# Patient Record
Sex: Female | Born: 1975 | Race: Black or African American | Hispanic: No | Marital: Single | Smoking: Current every day smoker
Health system: Southern US, Community
[De-identification: ages and names within clinical notes are randomized; demographics above are authoritative.]

## PROBLEM LIST (undated history)

## (undated) ENCOUNTER — Emergency Department (HOSPITAL_COMMUNITY): Discharge status: Absent without leave | Payer: Self-pay

## (undated) DIAGNOSIS — D219 Benign neoplasm of connective and other soft tissue, unspecified: Secondary | ICD-10-CM

---

## 2012-07-17 DIAGNOSIS — R238 Other skin changes: Secondary | ICD-10-CM | POA: Insufficient documentation

## 2012-07-17 DIAGNOSIS — R634 Abnormal weight loss: Secondary | ICD-10-CM | POA: Insufficient documentation

## 2012-07-17 DIAGNOSIS — M5430 Sciatica, unspecified side: Secondary | ICD-10-CM | POA: Insufficient documentation

## 2012-07-17 DIAGNOSIS — R110 Nausea: Secondary | ICD-10-CM | POA: Insufficient documentation

## 2012-07-28 DIAGNOSIS — M25579 Pain in unspecified ankle and joints of unspecified foot: Secondary | ICD-10-CM | POA: Insufficient documentation

## 2012-08-25 DIAGNOSIS — D259 Leiomyoma of uterus, unspecified: Secondary | ICD-10-CM | POA: Insufficient documentation

## 2012-08-25 DIAGNOSIS — D1803 Hemangioma of intra-abdominal structures: Secondary | ICD-10-CM | POA: Insufficient documentation

## 2012-10-13 DIAGNOSIS — K219 Gastro-esophageal reflux disease without esophagitis: Secondary | ICD-10-CM | POA: Insufficient documentation

## 2017-10-19 ENCOUNTER — Encounter (HOSPITAL_COMMUNITY): Payer: Self-pay | Admitting: Emergency Medicine

## 2017-10-19 ENCOUNTER — Emergency Department (HOSPITAL_COMMUNITY)
Admission: EM | Admit: 2017-10-19 | Discharge: 2017-10-19 | Disposition: A | Discharge status: Home / Self Care | Payer: Self-pay | Attending: Emergency Medicine | Admitting: Emergency Medicine

## 2017-10-19 DIAGNOSIS — K089 Disorder of teeth and supporting structures, unspecified: Secondary | ICD-10-CM | POA: Insufficient documentation

## 2017-10-19 DIAGNOSIS — K0889 Other specified disorders of teeth and supporting structures: Secondary | ICD-10-CM

## 2017-10-19 NOTE — ED Triage Notes (Signed)
Pt reports right sided dental pain (upper and lower) states she was seen at another hospital yesterday and given cephalexin and hydrocodone but reports pain is not getting any better and now there is some swelling.

## 2017-10-19 NOTE — Discharge Instructions (Addendum)
Please return for any problem. Follow up with Dentistry as instructed.

## 2017-10-19 NOTE — ED Provider Notes (Signed)
[place] EMERGENCY DEPARTMENT Provider Note   CSN: [id number] Arrival date & time: 10/19/17  0753     History   Chief Complaint Chief Complaint  Patient presents with  . Dental Pain    HPI Annette Vargas is a 42 y.o. female.  42 year old female without significant prior medical history presents with complaint of dental pain.  Patient was seen at another facility yesterday for same complaint.  She reports that she was started on antibiotic and given Norco at that facility for pain control.  She also reports getting a dental block with minimal improvement in her pain.  She presents today complaining of the same pain to the right lower jaw.  She denies associated fever.  She requests additional prescriptions for pain control.  She has yet to establish follow-up with dentistry.  The history is provided by the patient and medical records.  Dental Pain   This is a recurrent problem. The current episode started more than 2 days ago. The problem occurs daily. The problem has not changed since onset.The pain is mild. The treatment provided no relief.    History reviewed. No pertinent past medical history.  There are no active problems to display for this patient.   History reviewed. No pertinent surgical history.   OB History   None      Home Medications    Prior to Admission medications   Not on File    Family History No family history on file.  Social History Social History   Tobacco Use  . Smoking status: Not on file  Substance Use Topics  . Alcohol use: Not on file  . Drug use: Not on file     Allergies   Penicillins   Review of Systems Review of Systems  HENT: Positive for dental problem.   All other systems reviewed and are negative.    Physical Exam Updated Vital Signs BP 133/80   Pulse 71   Temp 98.9 F (37.2 C) (Oral)   Resp 15   SpO2 100%   Physical Exam  Constitutional: She is oriented to person, place, and time.  She appears well-developed and well-nourished. No distress.  HENT:  Head: Normocephalic and atraumatic.  Mouth/Throat: Oropharynx is clear and moist.  Mild dental tenderness to the right lower jaw -  At approximately the 29th - 31st teeth - no dental abscess noted   Eyes: Pupils are equal, round, and reactive to light. Conjunctivae and EOM are normal.  Neck: Normal range of motion. Neck supple.  Cardiovascular: Normal rate, regular rhythm and normal heart sounds.  Pulmonary/Chest: Effort normal and breath sounds normal. No respiratory distress.  Abdominal: Soft. She exhibits no distension. There is no tenderness.  Musculoskeletal: Normal range of motion. She exhibits no edema or deformity.  Neurological: She is alert and oriented to person, place, and time.  Skin: Skin is warm and dry.  Psychiatric: She has a normal mood and affect.  Nursing note and vitals reviewed.    ED Treatments / Results  Labs (all labs ordered are listed, but only abnormal results are displayed) Labs Reviewed - No data to display  EKG None  Radiology No results found.  Procedures Procedures (including critical care time)  Medications Ordered in ED Medications - No data to display   Initial Impression / Assessment and Plan / ED Course  I have reviewed the triage vital signs and the nursing notes.  Pertinent labs & imaging results that were available during my care  of the patient were reviewed by me and considered in my medical decision making (see chart for details).     MDM  Screen Complete  Patient is presenting for evaluation of dental pain.  Patient was already started on antibiotics 24 hours ago.  She already has a prescription of Norco to take at home for her pain.  She is without evidence of other acute dental pathology - such as abscess.  She declines repeat dental block.  She understands the need for close follow-up with dentistry.  Strict return precautions are given and  understood.  Final Clinical Impressions(s) / ED Diagnoses   Final diagnoses:  Pain, dental    ED Discharge Orders    None       Valarie Merino, MD 10/19/17 [phone]

## 2018-07-02 ENCOUNTER — Emergency Department (HOSPITAL_COMMUNITY)
Admission: EM | Admit: 2018-07-02 | Discharge: 2018-07-02 | Disposition: A | Discharge status: Home / Self Care | Payer: Self-pay | Attending: Emergency Medicine | Admitting: Emergency Medicine

## 2018-07-02 ENCOUNTER — Other Ambulatory Visit: Payer: Self-pay

## 2018-07-02 ENCOUNTER — Encounter (HOSPITAL_COMMUNITY): Payer: Self-pay | Admitting: *Deleted

## 2018-07-02 DIAGNOSIS — F17210 Nicotine dependence, cigarettes, uncomplicated: Secondary | ICD-10-CM | POA: Insufficient documentation

## 2018-07-02 DIAGNOSIS — K0889 Other specified disorders of teeth and supporting structures: Secondary | ICD-10-CM | POA: Insufficient documentation

## 2018-07-02 MED ORDER — CLINDAMYCIN HCL 300 MG PO CAPS: 300.0000 mg | ORAL_CAPSULE | Freq: Three times a day (TID) | ORAL | 0 refills | Status: DC

## 2018-07-02 MED ORDER — IBUPROFEN 600 MG PO TABS: 600.0000 mg | ORAL_TABLET | Freq: Three times a day (TID) | ORAL | 0 refills | Status: DC

## 2018-07-02 MED ORDER — TRAMADOL HCL 50 MG PO TABS: 50.0000 mg | ORAL_TABLET | Freq: Four times a day (QID) | ORAL | 0 refills | Status: DC | PRN

## 2018-07-02 NOTE — ED Notes (Signed)
Patient verbalizes understanding of discharge instructions. Opportunity for questioning and answers were provided. Armband removed by staff, pt discharged from ED ambulatory by self\  

## 2018-07-02 NOTE — ED Triage Notes (Signed)
PT reports dental pain at gum on RT upper side.

## 2018-07-02 NOTE — ED Provider Notes (Signed)
[facility] [place] EMERGENCY DEPARTMENT Provider Note   CSN: [id number] Arrival date & time: 07/02/18  1507    History   Chief Complaint Chief Complaint  Patient presents with  . Dental Pain    HPI Annette Vargas is a 43 y.o. female who presents with dental pain.  Past medical history significant for smoking.  The patient states that she was eating barbecue ribs and developed an acute onset of pain earlier today while at work.  The pain is over the right lower molar.  It is also over the gums.  The pain is constant and nonradiating.  She tried ibuprofen and Aleve without relief.  Since the pain was not improving she decided to come to the ED.  She has had problems with this tooth in the past.  She went to [facility] last year and received a dental block, pain medicine, antibiotics.  Initially she did not get relief but after several days of taking the medicine she has not not had a problem since then.  She has not seen a dentist in a long time.  Any fever or difficulty swallowing.     HPI  History reviewed. No pertinent past medical history.  There are no active problems to display for this patient.   History reviewed. No pertinent surgical history.   OB History   No obstetric history on file.      Home Medications    Prior to Admission medications   Not on File    Family History History reviewed. No pertinent family history.  Social History Social History   Tobacco Use  . Smoking status: Current Every Day Smoker    Packs/day: 0.50    Types: Cigarettes  . Smokeless tobacco: Never Used  Substance Use Topics  . Alcohol use: Not Currently  . Drug use: Yes    Frequency: 3.0 times per week    Types: Marijuana     Allergies   Penicillins   Review of Systems Review of Systems  Constitutional: Negative for fever.  HENT: Positive for dental problem.      Physical Exam Updated Vital Signs BP 137/79 (BP Location: Right Arm)   Pulse 72   Temp  98.7 F (37.1 C) (Oral)   Ht 6' (1.829 m)   Wt 56.7 kg   LMP 06/30/2018   SpO2 100%   BMI 16.95 kg/m   Physical Exam Vitals signs and nursing note reviewed.  Constitutional:      General: She is not in acute distress.    Appearance: Normal appearance. She is well-developed. She is not ill-appearing.     Comments: Calm and cooperative. NAD  HENT:     Head: Normocephalic and atraumatic.     Mouth/Throat:     Lips: Pink.     Mouth: Mucous membranes are moist.     Dentition: Dental tenderness present. No dental caries, dental abscesses or gum lesions.     Pharynx: Oropharynx is clear.     Tonsils: No tonsillar exudate.      Comments: Overall good dentition. Posterior lower wisdom teeth have been pulled Eyes:     General: No scleral icterus.       Right eye: No discharge.        Left eye: No discharge.     Conjunctiva/sclera: Conjunctivae normal.     Pupils: Pupils are equal, round, and reactive to light.  Neck:     Musculoskeletal: Normal range of motion.  Cardiovascular:     Rate  and Rhythm: Normal rate.  Pulmonary:     Effort: Pulmonary effort is normal. No respiratory distress.  Abdominal:     General: There is no distension.  Skin:    General: Skin is warm and dry.  Neurological:     Mental Status: She is alert and oriented to person, place, and time.  Psychiatric:        Behavior: Behavior normal.      ED Treatments / Results  Labs (all labs ordered are listed, but only abnormal results are displayed) Labs Reviewed - No data to display  EKG None  Radiology No results found.  Procedures Procedures (including critical care time)  Medications Ordered in ED Medications - No data to display   Initial Impression / Assessment and Plan / ED Course  I have reviewed the triage vital signs and the nursing notes.  Pertinent labs & imaging results that were available during my care of the patient were reviewed by me and considered in my medical decision  making (see chart for details).  44 year old with dental pain starting today. Patient is afebrile, non toxic appearing, and swallowing secretions well. No concerning findings on exam. No obvious abscess and doubt deep space head or neck infection. She was offered a dental block today and declined. Will give rx for Ibuprofen, Tramadol, Clindamycin. I gave patient referral to dentist and stressed the importance of dental follow up for ultimate management of dental pain    Final Clinical Impressions(s) / ED Diagnoses   Final diagnoses:  Pain, dental    ED Discharge Orders    None       Recardo Evangelist, PA-C 07/02/18 Amber, MD 07/02/18 1954

## 2018-07-02 NOTE — Discharge Instructions (Signed)
Take Ibuprofen 600mg  three times a day with food. This is for pain and inflammation Take Tramadol as needed for more severe pain Take Clindamycin three times a day for one week Eat soft foods until your tooth feels better Follow up with a dentist

## 2018-10-09 ENCOUNTER — Ambulatory Visit: Payer: BC Managed Care – PPO

## 2018-10-09 ENCOUNTER — Encounter: Payer: Self-pay | Admitting: Podiatry

## 2018-10-09 ENCOUNTER — Other Ambulatory Visit: Payer: Self-pay | Admitting: Podiatry

## 2018-10-09 ENCOUNTER — Ambulatory Visit (INDEPENDENT_AMBULATORY_CARE_PROVIDER_SITE_OTHER): Payer: BC Managed Care – PPO

## 2018-10-09 ENCOUNTER — Other Ambulatory Visit: Payer: Self-pay

## 2018-10-09 ENCOUNTER — Ambulatory Visit: Payer: BC Managed Care – PPO | Admitting: Podiatry

## 2018-10-09 VITALS — BP 121/64 | HR 75 | Temp 98.1°F | Resp 16

## 2018-10-09 DIAGNOSIS — M216X9 Other acquired deformities of unspecified foot: Secondary | ICD-10-CM | POA: Diagnosis not present

## 2018-10-09 DIAGNOSIS — M79672 Pain in left foot: Secondary | ICD-10-CM | POA: Diagnosis not present

## 2018-10-09 DIAGNOSIS — B070 Plantar wart: Secondary | ICD-10-CM

## 2018-10-09 DIAGNOSIS — M79671 Pain in right foot: Secondary | ICD-10-CM

## 2018-10-09 DIAGNOSIS — B351 Tinea unguium: Secondary | ICD-10-CM

## 2018-10-09 NOTE — Progress Notes (Signed)
   Subjective:    Patient ID: Annette Vargas, female    DOB: 1975/07/22, 43 y.o.   MRN: [id number]  HPI    Review of Systems  All other systems reviewed and are negative.      Objective:   Physical Exam        Assessment & Plan:

## 2018-10-12 NOTE — Progress Notes (Signed)
Subjective:   Patient ID: Annette Vargas, female   DOB: 43 y.o.   MRN: [id number]   HPI Patient presents with a lot of lesions plantar aspect feet that upon walking she states are painful.  Also concerned about nail disease and discoloration of skin and patient does not smoke likes to be active   Review of Systems  All other systems reviewed and are negative.       Objective:  Physical Exam Vitals signs and nursing note reviewed.  Constitutional:      Appearance: She is well-developed.  Pulmonary:     Effort: Pulmonary effort is normal.  Musculoskeletal: Normal range of motion.  Skin:    General: Skin is warm.  Neurological:     Mental Status: She is alert.     Neurovascular status found to be within normal limits with patient found to have lesions on the left foot sub-met and great toe that upon debridement show pinpoint bleeding that is painful with lateral pressure.  Also states that the nails she is concerned about discoloration and thickness and does have good digital perfusion     Assessment:  Nail disease with verruca plantaris left with moderate discomfort associated with it     Plan:  H&P conditions reviewed and today deep debridement of lesions left accomplished and applied chemical to create immune response with sterile dressing.  I then went ahead discussed nails and we discussed different treatment options available and are going to defer at the current time

## 2018-11-06 ENCOUNTER — Ambulatory Visit: Payer: BC Managed Care – PPO | Admitting: Podiatry

## 2020-02-21 ENCOUNTER — Emergency Department (HOSPITAL_COMMUNITY)
Admission: EM | Admit: 2020-02-21 | Discharge: 2020-02-21 | Disposition: A | Discharge status: Home / Self Care | Payer: BC Managed Care – PPO | Attending: Emergency Medicine | Admitting: Emergency Medicine

## 2020-02-21 ENCOUNTER — Encounter (HOSPITAL_COMMUNITY): Payer: Self-pay | Admitting: Emergency Medicine

## 2020-02-21 ENCOUNTER — Other Ambulatory Visit: Payer: Self-pay

## 2020-02-21 DIAGNOSIS — D500 Iron deficiency anemia secondary to blood loss (chronic): Secondary | ICD-10-CM | POA: Insufficient documentation

## 2020-02-21 DIAGNOSIS — Z20822 Contact with and (suspected) exposure to covid-19: Secondary | ICD-10-CM | POA: Insufficient documentation

## 2020-02-21 DIAGNOSIS — N3000 Acute cystitis without hematuria: Secondary | ICD-10-CM | POA: Insufficient documentation

## 2020-02-21 DIAGNOSIS — R112 Nausea with vomiting, unspecified: Secondary | ICD-10-CM | POA: Diagnosis not present

## 2020-02-21 DIAGNOSIS — F17210 Nicotine dependence, cigarettes, uncomplicated: Secondary | ICD-10-CM | POA: Insufficient documentation

## 2020-02-21 DIAGNOSIS — R109 Unspecified abdominal pain: Secondary | ICD-10-CM | POA: Diagnosis not present

## 2020-02-21 HISTORY — DX: Benign neoplasm of connective and other soft tissue, unspecified: D21.9

## 2020-02-21 LAB — CBC
HCT: 32.4 % — ABNORMAL LOW (ref 36.0–46.0)
Hemoglobin: 9.4 g/dL — ABNORMAL LOW (ref 12.0–15.0)
MCH: 22.5 pg — ABNORMAL LOW (ref 26.0–34.0)
MCHC: 29 g/dL — ABNORMAL LOW (ref 30.0–36.0)
MCV: 77.5 fL — ABNORMAL LOW (ref 80.0–100.0)
Platelets: 339 10*3/uL (ref 150–400)
RBC: 4.18 MIL/uL (ref 3.87–5.11)
RDW: 17.4 % — ABNORMAL HIGH (ref 11.5–15.5)
WBC: 6.1 10*3/uL (ref 4.0–10.5)
nRBC: 0 % (ref 0.0–0.2)

## 2020-02-21 LAB — URINALYSIS, ROUTINE W REFLEX MICROSCOPIC
Bacteria, UA: NONE SEEN
Bilirubin Urine: NEGATIVE
Glucose, UA: NEGATIVE mg/dL
Hgb urine dipstick: NEGATIVE
Ketones, ur: 20 mg/dL — AB
Nitrite: NEGATIVE
Protein, ur: NEGATIVE mg/dL
Specific Gravity, Urine: 1.023 (ref 1.005–1.030)
pH: 5 (ref 5.0–8.0)

## 2020-02-21 LAB — COMPREHENSIVE METABOLIC PANEL
ALT: 16 U/L (ref 0–44)
AST: 20 U/L (ref 15–41)
Albumin: 4.6 g/dL (ref 3.5–5.0)
Alkaline Phosphatase: 45 U/L (ref 38–126)
Anion gap: 10 (ref 5–15)
BUN: 14 mg/dL (ref 6–20)
CO2: 21 mmol/L — ABNORMAL LOW (ref 22–32)
Calcium: 9.1 mg/dL (ref 8.9–10.3)
Chloride: 106 mmol/L (ref 98–111)
Creatinine, Ser: 0.82 mg/dL (ref 0.44–1.00)
GFR, Estimated: >60 mL/min (ref >60)
Glucose, Bld: 106 mg/dL — ABNORMAL HIGH (ref 70–99)
Potassium: 3.5 mmol/L (ref 3.5–5.1)
Sodium: 137 mmol/L (ref 135–145)
Total Bilirubin: 1.3 mg/dL — ABNORMAL HIGH (ref 0.3–1.2)
Total Protein: 8 g/dL (ref 6.5–8.1)

## 2020-02-21 LAB — I-STAT BETA HCG BLOOD, ED (MC, WL, AP ONLY): I-stat hCG, quantitative: <5 m[IU]/mL (ref <5)

## 2020-02-21 LAB — RESPIRATORY PANEL BY RT PCR (FLU A&B, COVID)
Influenza A by PCR: NEGATIVE
Influenza B by PCR: NEGATIVE
SARS Coronavirus 2 by RT PCR: NEGATIVE

## 2020-02-21 LAB — LIPASE, BLOOD: Lipase: 28 U/L (ref 11–51)

## 2020-02-21 MED ORDER — ONDANSETRON HCL 4 MG/2ML IJ SOLN
4.0000 mg | Freq: Once | INTRAMUSCULAR | Status: AC | PRN
  Administered 2020-02-21: 4 mg via INTRAVENOUS
  Filled 2020-02-21: qty 2

## 2020-02-21 MED ORDER — ONDANSETRON 4 MG PO TBDP: 4.0000 mg | ORAL_TABLET | Freq: Three times a day (TID) | ORAL | 0 refills | Status: AC | PRN

## 2020-02-21 MED ORDER — FERROUS SULFATE 325 (65 FE) MG PO TABS: ORAL_TABLET | ORAL | 0 refills | Status: AC

## 2020-02-21 MED ORDER — CEPHALEXIN 500 MG PO CAPS: 500.0000 mg | ORAL_CAPSULE | Freq: Four times a day (QID) | ORAL | 0 refills | Status: AC

## 2020-02-21 MED ORDER — SODIUM CHLORIDE 0.9 % IV BOLUS
1000.0000 mL | Freq: Once | INTRAVENOUS | Status: AC
  Administered 2020-02-21: 1000 mL via INTRAVENOUS

## 2020-02-21 NOTE — ED Triage Notes (Signed)
Patient presents with lower abdominal pain and vomiting x2 days., hx fibroids. Patient denies diarrhea, unsure of fevers. Patient states she has had sick coworkers, but they tested negative for COVID.

## 2020-02-21 NOTE — ED Notes (Signed)
Patient tolerated ginger ale well.  

## 2020-02-21 NOTE — Discharge Instructions (Addendum)
Thank you for allowing me to care for you today in the Emergency Department.   Your COVID-19 test was negative today.  Your urine was concerning for infection.  Urine culture has been sent to the lab.  Take 1 tablet of Keflex every 6 hours for the next 5 days.  Make sure to drink plenty of fluids if you continue to have loose stools.  For nausea and vomiting, let 1 tablet of Zofran dissolve in your tongue every 8 hours as needed.  Your hemoglobin is low today.  This is likely secondary to your history of uterine fibroids and heavy periods.  You can follow-up with primary care or your OB/GYN for anemia.  Take 1 tablet of ferrous sulfate every other day.  Return the emergency department if you have uncontrollable vomiting despite taking Zofran, severe abdominal pain with fevers, if you start making urine, if you have difficulty breathing, or other new, concerning symptoms.

## 2020-02-21 NOTE — ED Provider Notes (Signed)
[facility] DEPT Provider Note   CSN: [id number] Arrival date & time: 02/21/20  0203     History Chief Complaint  Patient presents with  . Abdominal Pain  . Emesis    Annette Vargas is a 44 y.o. female with a history of uterine fibroids and depression who presents to the emergency department with a chief complaint of vomiting.  The patient reports that she had 1 episode of nonbloody, nonbilious vomiting at work earlier [employer] and was told that she needed to come and get evaluated before she was able to return to work.  She reports that she has been having 2 days of intermittent lower abdominal pain.  She characterizes the pain as a crampy.  The pain will last for a few seconds and then resolved.  She reports that the pain is intense with onset and she is pain-free when it resolves.  She also reports that she has had 3-4 episodes of nonbloody diarrhea.  She has had a very mild cough, reports subjective fever and chills, and feels as if she has fluid located behind her right ear.  She denies sore throat, hearing loss, otorrhea, shortness of breath, chest pain, dysuria, hematuria, flank pain, vaginal bleeding or discharge, rash, loss of sense of taste or smell, or leg swelling.  No treatment for her symptoms prior to arrival.  She reports that she has been working for the last few days of one of her coworkers became sick last night with similar symptoms and informed her that he had food poisoning.  States that he tested negative for COVID-19.  She reports that a second coworker was also ill with similar symptoms, but she is unsure of their diagnosis.  The patient has not been vaccinated against COVID-19.  Last menstrual cycle was 2 weeks ago.  She reported this was heavy, which is her baseline.  She denies dizziness or lightheadedness or worsening fatigue.  The history is provided by the patient and medical records. No language interpreter was used.        Past Medical History:  Diagnosis Date  . Fibroids     Patient Active Problem List   Diagnosis Date Noted  . GERD (gastroesophageal reflux disease) 10/13/2012  . Hemangioma of liver 08/25/2012  . Uterine fibroid 08/25/2012  . Ankle pain 07/28/2012  . Abnormal weight loss 07/17/2012  . Easy bruising 07/17/2012  . Nausea 07/17/2012  . Sciatica 07/17/2012    History reviewed. No pertinent surgical history.   OB History   No obstetric history on file.     No family history on file.  Social History   Tobacco Use  . Smoking status: Current Every Day Smoker    Packs/day: 0.50    Types: Cigarettes  . Smokeless tobacco: Never Used  Substance Use Topics  . Alcohol use: Not Currently  . Drug use: Yes    Frequency: 3.0 times per week    Types: Marijuana    Home Medications Prior to Admission medications   Medication Sig Start Date End Date Taking? Authorizing Provider  cephALEXin (KEFLEX) 500 MG capsule Take 1 capsule (500 mg total) by mouth 4 (four) times daily for 5 days. 02/21/20 02/26/20  Nam Vossler A, PA-C  ferrous sulfate 325 (65 FE) MG tablet Take 1 tablet by mouth every other day. 02/21/20   Zollie Clemence A, PA-C  ondansetron (ZOFRAN ODT) 4 MG disintegrating tablet Take 1 tablet (4 mg total) by mouth every 8 (eight) hours as needed. 02/21/20  Tresea Heine A, PA-C    Allergies    Penicillins and Erythromycin  Review of Systems   Review of Systems  Constitutional: Positive for chills and fever. Negative for activity change.  HENT: Positive for ear pain. Negative for congestion, drooling, rhinorrhea, sinus pressure, sinus pain and sore throat.   Eyes: Negative for visual disturbance.  Respiratory: Negative for shortness of breath.   Cardiovascular: Negative for chest pain.  Gastrointestinal: Positive for abdominal pain, diarrhea, nausea and vomiting. Negative for anal bleeding, blood in stool, constipation and rectal pain.  Genitourinary: Negative for dysuria,  flank pain, urgency, vaginal bleeding, vaginal discharge and vaginal pain.  Musculoskeletal: Negative for back pain, gait problem, joint swelling and neck stiffness.  Skin: Negative for rash.  Allergic/Immunologic: Negative for immunocompromised state.  Neurological: Negative for seizures, syncope, weakness, numbness and headaches.  Psychiatric/Behavioral: Negative for confusion.    Physical Exam Updated Vital Signs BP 115/66   Pulse 62   Temp 98 F (36.7 C) (Oral)   Resp 18   Ht 6' (1.829 m)   Wt 56.7 kg   SpO2 100%   BMI 16.95 kg/m   Physical Exam Vitals and nursing note reviewed.  Constitutional:      General: She is not in acute distress.    Appearance: She is not ill-appearing, toxic-appearing or diaphoretic.     Comments: Thin female in no acute distress.  HENT:     Head: Normocephalic.     Right Ear: Ear canal and external ear normal.     Left Ear: Ear canal and external ear normal.     Ears:     Comments: Air-fluid level to the right TM.  Left TM is unremarkable.  No mastoid tenderness bilaterally.    Nose: Nose normal. No congestion or rhinorrhea.     Mouth/Throat:     Pharynx: No oropharyngeal exudate or posterior oropharyngeal erythema.  Eyes:     General: No scleral icterus.    Conjunctiva/sclera: Conjunctivae normal.  Cardiovascular:     Rate and Rhythm: Normal rate and regular rhythm.     Pulses: Normal pulses.     Heart sounds: Normal heart sounds. No murmur heard.  No friction rub. No gallop.   Pulmonary:     Effort: Pulmonary effort is normal. No respiratory distress.     Breath sounds: No stridor. No wheezing, rhonchi or rales.  Chest:     Chest wall: No tenderness.  Abdominal:     General: There is no distension.     Palpations: Abdomen is soft. There is no mass.     Tenderness: There is no abdominal tenderness. There is no right CVA tenderness, left CVA tenderness, guarding or rebound.     Hernia: No hernia is present.     Comments:  Hyperactive bowel sounds in all 4 quadrants.  Abdomen is soft and nondistended.  No hepatosplenomegaly.  No palpable masses.  No tenderness over McBurney's point.  Negative Murphy sign.  Musculoskeletal:        General: No tenderness.     Cervical back: Neck supple.     Right lower leg: No edema.     Left lower leg: No edema.  Skin:    General: Skin is warm.     Findings: No rash.  Neurological:     Mental Status: She is alert.     Sensory: No sensory deficit.  Psychiatric:        Behavior: Behavior normal.     ED Results /  Procedures / Treatments   Labs (all labs ordered are listed, but only abnormal results are displayed) Labs Reviewed  COMPREHENSIVE METABOLIC PANEL - Abnormal; Notable for the following components:      Result Value   CO2 21 (*)    Glucose, Bld 106 (*)    Total Bilirubin 1.3 (*)    All other components within normal limits  CBC - Abnormal; Notable for the following components:   Hemoglobin 9.4 (*)    HCT 32.4 (*)    MCV 77.5 (*)    MCH 22.5 (*)    MCHC 29.0 (*)    RDW 17.4 (*)    All other components within normal limits  URINALYSIS, ROUTINE W REFLEX MICROSCOPIC - Abnormal; Notable for the following components:   APPearance CLOUDY (*)    Ketones, ur 20 (*)    Leukocytes,Ua MODERATE (*)    All other components within normal limits  RESPIRATORY PANEL BY RT PCR (FLU A&B, COVID)  LIPASE, BLOOD  I-STAT BETA HCG BLOOD, ED ([facility], [facility], [facility] ONLY)    EKG None  Radiology No results found.  Procedures Procedures (including critical care time)  Medications Ordered in ED Medications  ondansetron (ZOFRAN) injection 4 mg (4 mg Intravenous Given 02/21/20 0233)  sodium chloride 0.9 % bolus 1,000 mL (0 mLs Intravenous Stopped 02/21/20 0456)    ED Course  I have reviewed the triage vital signs and the nursing notes.  Pertinent labs & imaging results that were available during my care of the patient were reviewed by me and considered in my medical decision  making (see chart for details).    MDM Rules/Calculators/A&P                          44 year old female with a history of uterine fibroids and depression who presents the emergency department with 24 h of intermittent lower abdominal pain, several episodes of loose stools, one episode of nonbloody, nonbilious vomiting, and URI symptoms.  Vital signs are stable.  Lungs are clear to auscultation bilaterally.  She has no increased work of breathing.  ENT exam is unremarkable.  Will give Zofran and IV fluids and plan to reassess.  She declines pain medication at this time.  Labs have been reviewed by me.  She has a microcytic anemia with a hemoglobin of 9.4.  I suspect this is chronic secondary to uterine fibroids and abnormal uterine bleeding.  Anemia is asymptomatic at this time.  She has been advised to follow-up with this with primary care for further evaluation and I will discharge her with a prescription of ferrous sulfate.  Pregnancy test is negative.  COVID-19 test is negative.  She has no leukocytosis.  Abdomen is benign.  UA with leukocyte esterase.  However, it is nitrite negative.  Urine culture has been sent.  However, since she has been having lower abdominal pain, will treat for UTI with Keflex.  She is agreeable with this plan.   On re-evaluation, she reports that nausea and vomiting had improved and she was successfully fluid challenge.  She is feeling much better and is ready for discharge.  I have a low suspicion for COVID-19, diverticulitis, appendicitis, pyelonephritis, obstructive uropathy, PID, pancreatitis, or cholecystitis.  She is hemodynamically stable and in no acute distress.  Safe discharge to home with outpatient follow-up as indicated.  Final Clinical Impression(s) / ED Diagnoses Final diagnoses:  Non-intractable vomiting with nausea, unspecified vomiting type  Acute cystitis without hematuria  Iron deficiency anemia due to chronic blood loss    Rx / DC Orders ED  Discharge Orders         Ordered    cephALEXin (KEFLEX) 500 MG capsule  4 times daily        02/21/20 0631    ondansetron (ZOFRAN ODT) 4 MG disintegrating tablet  Every 8 hours PRN        02/21/20 0631    ferrous sulfate 325 (65 FE) MG tablet        02/21/20 0635           Nahmir Zeidman A, PA-C 02/21/20 0301    Rolland Porter, MD 02/21/20 2303

## 2021-05-09 ENCOUNTER — Encounter (HOSPITAL_COMMUNITY): Payer: Self-pay

## 2021-05-09 ENCOUNTER — Emergency Department (HOSPITAL_COMMUNITY): Payer: BC Managed Care – PPO

## 2021-05-09 ENCOUNTER — Emergency Department (HOSPITAL_COMMUNITY)
Admission: EM | Admit: 2021-05-09 | Discharge: 2021-05-09 | Disposition: A | Discharge status: Home / Self Care | Payer: BC Managed Care – PPO | Attending: Emergency Medicine | Admitting: Emergency Medicine

## 2021-05-09 DIAGNOSIS — R059 Cough, unspecified: Secondary | ICD-10-CM | POA: Diagnosis not present

## 2021-05-09 DIAGNOSIS — R072 Precordial pain: Secondary | ICD-10-CM | POA: Diagnosis not present

## 2021-05-09 DIAGNOSIS — M25551 Pain in right hip: Secondary | ICD-10-CM | POA: Diagnosis present

## 2021-05-09 DIAGNOSIS — R55 Syncope and collapse: Secondary | ICD-10-CM | POA: Insufficient documentation

## 2021-05-09 DIAGNOSIS — Y92410 Unspecified street and highway as the place of occurrence of the external cause: Secondary | ICD-10-CM | POA: Insufficient documentation

## 2021-05-09 DIAGNOSIS — R0789 Other chest pain: Secondary | ICD-10-CM

## 2021-05-09 DIAGNOSIS — M546 Pain in thoracic spine: Secondary | ICD-10-CM | POA: Diagnosis not present

## 2021-05-09 LAB — I-STAT BETA HCG BLOOD, ED (MC, WL, AP ONLY): I-stat hCG, quantitative: <5 m[IU]/mL (ref <5)

## 2021-05-09 MED ORDER — LIDOCAINE 4 % EX PTCH: 1.0000 | MEDICATED_PATCH | Freq: Two times a day (BID) | CUTANEOUS | 0 refills | Status: AC | PRN

## 2021-05-09 MED ORDER — METHOCARBAMOL 500 MG PO TABS: 500.0000 mg | ORAL_TABLET | Freq: Three times a day (TID) | ORAL | 0 refills | Status: AC | PRN

## 2021-05-09 MED ORDER — OXYCODONE-ACETAMINOPHEN 5-325 MG PO TABS
1.0000 | ORAL_TABLET | Freq: Once | ORAL | Status: AC
  Administered 2021-05-09: 09:00:00 1 via ORAL
  Filled 2021-05-09: qty 1
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
